# Patient Record
Sex: Female | Born: 1945 | Race: White | Hispanic: No | Marital: Married | Smoking: Never smoker
Health system: Southern US, Community
[De-identification: ages and names within clinical notes are randomized; demographics above are authoritative.]

## PROBLEM LIST (undated history)

## (undated) DIAGNOSIS — I10 Essential (primary) hypertension: Secondary | ICD-10-CM

## (undated) DIAGNOSIS — E079 Disorder of thyroid, unspecified: Secondary | ICD-10-CM

## (undated) DIAGNOSIS — J45909 Unspecified asthma, uncomplicated: Secondary | ICD-10-CM

## (undated) DIAGNOSIS — E119 Type 2 diabetes mellitus without complications: Secondary | ICD-10-CM

## (undated) HISTORY — PX: BREAST BIOPSY: SHX20

## (undated) HISTORY — PX: CARPAL TUNNEL RELEASE: SHX101

## (undated) HISTORY — PX: TONSILLECTOMY: SUR1361

---

## 2020-07-23 ENCOUNTER — Ambulatory Visit: Payer: Self-pay | Admitting: Physician Assistant

## 2020-10-17 ENCOUNTER — Other Ambulatory Visit: Payer: Self-pay | Admitting: Critical Care Medicine

## 2020-10-17 DIAGNOSIS — Z1231 Encounter for screening mammogram for malignant neoplasm of breast: Secondary | ICD-10-CM

## 2020-12-31 ENCOUNTER — Ambulatory Visit: Payer: Self-pay

## 2021-01-30 ENCOUNTER — Other Ambulatory Visit: Payer: Self-pay

## 2021-01-30 ENCOUNTER — Ambulatory Visit
Admission: RE | Admit: 2021-01-30 | Discharge: 2021-01-30 | Disposition: A | Discharge status: Home / Self Care | Payer: Medicare HMO | Source: Ambulatory Visit | Attending: Critical Care Medicine | Admitting: Critical Care Medicine

## 2021-01-30 DIAGNOSIS — Z1231 Encounter for screening mammogram for malignant neoplasm of breast: Secondary | ICD-10-CM

## 2021-02-06 ENCOUNTER — Other Ambulatory Visit: Payer: Self-pay | Admitting: Family Medicine

## 2021-02-06 DIAGNOSIS — E2839 Other primary ovarian failure: Secondary | ICD-10-CM

## 2021-07-26 ENCOUNTER — Ambulatory Visit
Admission: RE | Admit: 2021-07-26 | Discharge: 2021-07-26 | Disposition: A | Discharge status: Home / Self Care | Payer: Medicare HMO | Source: Ambulatory Visit | Attending: Family Medicine | Admitting: Family Medicine

## 2021-07-26 DIAGNOSIS — E2839 Other primary ovarian failure: Secondary | ICD-10-CM

## 2021-10-14 DIAGNOSIS — G5603 Carpal tunnel syndrome, bilateral upper limbs: Secondary | ICD-10-CM | POA: Diagnosis not present

## 2021-10-23 DIAGNOSIS — G5601 Carpal tunnel syndrome, right upper limb: Secondary | ICD-10-CM | POA: Diagnosis not present

## 2021-12-13 ENCOUNTER — Other Ambulatory Visit: Payer: Self-pay | Admitting: Family Medicine

## 2021-12-13 DIAGNOSIS — Z1231 Encounter for screening mammogram for malignant neoplasm of breast: Secondary | ICD-10-CM

## 2022-01-31 ENCOUNTER — Ambulatory Visit
Admission: RE | Admit: 2022-01-31 | Discharge: 2022-01-31 | Disposition: A | Discharge status: Home / Self Care | Payer: Medicare HMO | Source: Ambulatory Visit | Attending: Family Medicine | Admitting: Family Medicine

## 2022-01-31 DIAGNOSIS — Z1231 Encounter for screening mammogram for malignant neoplasm of breast: Secondary | ICD-10-CM | POA: Diagnosis not present

## 2022-02-03 ENCOUNTER — Other Ambulatory Visit: Payer: Self-pay | Admitting: Family Medicine

## 2022-02-03 DIAGNOSIS — E2839 Other primary ovarian failure: Secondary | ICD-10-CM | POA: Diagnosis not present

## 2022-02-03 DIAGNOSIS — E0590 Thyrotoxicosis, unspecified without thyrotoxic crisis or storm: Secondary | ICD-10-CM | POA: Diagnosis not present

## 2022-02-03 DIAGNOSIS — J4540 Moderate persistent asthma, uncomplicated: Secondary | ICD-10-CM | POA: Diagnosis not present

## 2022-02-03 DIAGNOSIS — E039 Hypothyroidism, unspecified: Secondary | ICD-10-CM | POA: Diagnosis not present

## 2022-02-03 DIAGNOSIS — E7800 Pure hypercholesterolemia, unspecified: Secondary | ICD-10-CM | POA: Diagnosis not present

## 2022-02-03 DIAGNOSIS — R928 Other abnormal and inconclusive findings on diagnostic imaging of breast: Secondary | ICD-10-CM

## 2022-02-03 DIAGNOSIS — R7303 Prediabetes: Secondary | ICD-10-CM | POA: Diagnosis not present

## 2022-02-03 DIAGNOSIS — Z0001 Encounter for general adult medical examination with abnormal findings: Secondary | ICD-10-CM | POA: Diagnosis not present

## 2022-02-03 DIAGNOSIS — I10 Essential (primary) hypertension: Secondary | ICD-10-CM | POA: Diagnosis not present

## 2022-02-03 DIAGNOSIS — Z79899 Other long term (current) drug therapy: Secondary | ICD-10-CM | POA: Diagnosis not present

## 2022-02-04 DIAGNOSIS — Z0001 Encounter for general adult medical examination with abnormal findings: Secondary | ICD-10-CM | POA: Diagnosis not present

## 2022-02-12 ENCOUNTER — Other Ambulatory Visit: Payer: Medicare HMO

## 2022-02-19 ENCOUNTER — Other Ambulatory Visit: Payer: Medicare HMO

## 2022-02-25 DIAGNOSIS — Z23 Encounter for immunization: Secondary | ICD-10-CM | POA: Diagnosis not present

## 2022-02-27 ENCOUNTER — Ambulatory Visit
Admission: RE | Admit: 2022-02-27 | Discharge: 2022-02-27 | Disposition: A | Discharge status: Home / Self Care | Payer: Medicare HMO | Source: Ambulatory Visit | Attending: Family Medicine | Admitting: Family Medicine

## 2022-02-27 DIAGNOSIS — N6001 Solitary cyst of right breast: Secondary | ICD-10-CM | POA: Diagnosis not present

## 2022-02-27 DIAGNOSIS — R928 Other abnormal and inconclusive findings on diagnostic imaging of breast: Secondary | ICD-10-CM

## 2022-04-16 DIAGNOSIS — R5383 Other fatigue: Secondary | ICD-10-CM | POA: Diagnosis not present

## 2022-04-16 DIAGNOSIS — J029 Acute pharyngitis, unspecified: Secondary | ICD-10-CM | POA: Diagnosis not present

## 2022-04-16 DIAGNOSIS — R051 Acute cough: Secondary | ICD-10-CM | POA: Diagnosis not present

## 2022-04-16 DIAGNOSIS — R52 Pain, unspecified: Secondary | ICD-10-CM | POA: Diagnosis not present

## 2022-04-16 DIAGNOSIS — Z03818 Encounter for observation for suspected exposure to other biological agents ruled out: Secondary | ICD-10-CM | POA: Diagnosis not present

## 2022-04-28 DIAGNOSIS — R7303 Prediabetes: Secondary | ICD-10-CM | POA: Diagnosis not present

## 2022-04-28 DIAGNOSIS — Z961 Presence of intraocular lens: Secondary | ICD-10-CM | POA: Diagnosis not present

## 2022-04-28 DIAGNOSIS — I10 Essential (primary) hypertension: Secondary | ICD-10-CM | POA: Diagnosis not present

## 2022-04-28 DIAGNOSIS — H524 Presbyopia: Secondary | ICD-10-CM | POA: Diagnosis not present

## 2022-04-28 DIAGNOSIS — H35033 Hypertensive retinopathy, bilateral: Secondary | ICD-10-CM | POA: Diagnosis not present

## 2022-05-08 DIAGNOSIS — E039 Hypothyroidism, unspecified: Secondary | ICD-10-CM | POA: Diagnosis not present

## 2022-06-03 DIAGNOSIS — H6691 Otitis media, unspecified, right ear: Secondary | ICD-10-CM | POA: Diagnosis not present

## 2022-06-03 DIAGNOSIS — Z03818 Encounter for observation for suspected exposure to other biological agents ruled out: Secondary | ICD-10-CM | POA: Diagnosis not present

## 2022-06-03 DIAGNOSIS — R051 Acute cough: Secondary | ICD-10-CM | POA: Diagnosis not present

## 2022-06-03 DIAGNOSIS — J069 Acute upper respiratory infection, unspecified: Secondary | ICD-10-CM | POA: Diagnosis not present

## 2022-06-03 DIAGNOSIS — J029 Acute pharyngitis, unspecified: Secondary | ICD-10-CM | POA: Diagnosis not present

## 2022-06-08 DIAGNOSIS — H66003 Acute suppurative otitis media without spontaneous rupture of ear drum, bilateral: Secondary | ICD-10-CM | POA: Diagnosis not present

## 2022-06-16 DIAGNOSIS — H6501 Acute serous otitis media, right ear: Secondary | ICD-10-CM | POA: Diagnosis not present

## 2022-06-16 DIAGNOSIS — H9191 Unspecified hearing loss, right ear: Secondary | ICD-10-CM | POA: Diagnosis not present

## 2022-07-04 DIAGNOSIS — H903 Sensorineural hearing loss, bilateral: Secondary | ICD-10-CM | POA: Diagnosis not present

## 2022-07-15 DIAGNOSIS — H8110 Benign paroxysmal vertigo, unspecified ear: Secondary | ICD-10-CM | POA: Diagnosis not present

## 2022-07-31 DIAGNOSIS — E119 Type 2 diabetes mellitus without complications: Secondary | ICD-10-CM | POA: Diagnosis not present

## 2022-07-31 DIAGNOSIS — R200 Anesthesia of skin: Secondary | ICD-10-CM | POA: Diagnosis not present

## 2022-07-31 DIAGNOSIS — I10 Essential (primary) hypertension: Secondary | ICD-10-CM | POA: Diagnosis not present

## 2022-07-31 DIAGNOSIS — E7800 Pure hypercholesterolemia, unspecified: Secondary | ICD-10-CM | POA: Diagnosis not present

## 2022-07-31 DIAGNOSIS — J4540 Moderate persistent asthma, uncomplicated: Secondary | ICD-10-CM | POA: Diagnosis not present

## 2022-07-31 DIAGNOSIS — E039 Hypothyroidism, unspecified: Secondary | ICD-10-CM | POA: Diagnosis not present

## 2022-08-05 DIAGNOSIS — H903 Sensorineural hearing loss, bilateral: Secondary | ICD-10-CM | POA: Diagnosis not present

## 2022-08-05 DIAGNOSIS — H8102 Meniere's disease, left ear: Secondary | ICD-10-CM | POA: Diagnosis not present

## 2022-08-18 ENCOUNTER — Encounter: Payer: Self-pay | Admitting: Physical Medicine & Rehabilitation

## 2022-09-18 ENCOUNTER — Encounter: Payer: Medicare HMO | Admitting: Physical Medicine & Rehabilitation

## 2022-10-07 ENCOUNTER — Ambulatory Visit: Payer: Medicare HMO | Admitting: Physical Medicine & Rehabilitation

## 2022-10-09 ENCOUNTER — Ambulatory Visit: Payer: Medicare HMO | Admitting: Physical Medicine & Rehabilitation

## 2022-10-16 ENCOUNTER — Encounter: Payer: Self-pay | Admitting: Physical Medicine & Rehabilitation

## 2022-10-16 ENCOUNTER — Encounter: Payer: Medicare HMO | Attending: Physical Medicine & Rehabilitation | Admitting: Physical Medicine & Rehabilitation

## 2022-10-16 VITALS — BP 142/79 | HR 82 | Ht 59.0 in | Wt 179.6 lb

## 2022-10-16 DIAGNOSIS — R202 Paresthesia of skin: Secondary | ICD-10-CM | POA: Insufficient documentation

## 2022-10-16 DIAGNOSIS — R200 Anesthesia of skin: Secondary | ICD-10-CM | POA: Diagnosis not present

## 2022-10-23 ENCOUNTER — Encounter: Payer: Self-pay | Admitting: Physical Medicine & Rehabilitation

## 2022-10-23 NOTE — Progress Notes (Signed)
EMG/NCV LUE see scanned report under media tab

## 2022-11-19 DIAGNOSIS — G5603 Carpal tunnel syndrome, bilateral upper limbs: Secondary | ICD-10-CM | POA: Diagnosis not present

## 2022-11-20 ENCOUNTER — Other Ambulatory Visit: Payer: Self-pay | Admitting: Orthopedic Surgery

## 2022-12-09 ENCOUNTER — Other Ambulatory Visit: Payer: Self-pay

## 2022-12-09 ENCOUNTER — Encounter (HOSPITAL_BASED_OUTPATIENT_CLINIC_OR_DEPARTMENT_OTHER): Payer: Self-pay | Admitting: Orthopedic Surgery

## 2022-12-15 ENCOUNTER — Encounter (HOSPITAL_BASED_OUTPATIENT_CLINIC_OR_DEPARTMENT_OTHER)
Admission: RE | Admit: 2022-12-15 | Discharge: 2022-12-15 | Disposition: A | Discharge status: Home / Self Care | Payer: Medicare HMO | Source: Ambulatory Visit | Attending: Orthopedic Surgery | Admitting: Orthopedic Surgery

## 2022-12-15 DIAGNOSIS — Z01818 Encounter for other preprocedural examination: Secondary | ICD-10-CM | POA: Insufficient documentation

## 2022-12-15 LAB — BASIC METABOLIC PANEL
Anion gap: 8 (ref 5–15)
BUN: 18 mg/dL (ref 8–23)
CO2: 28 mmol/L (ref 22–32)
Calcium: 9.5 mg/dL (ref 8.9–10.3)
Chloride: 101 mmol/L (ref 98–111)
Creatinine, Ser: 0.65 mg/dL (ref 0.44–1.00)
GFR, Estimated: >60 mL/min (ref >60)
Glucose, Bld: 100 mg/dL — ABNORMAL HIGH (ref 70–99)
Potassium: 4.1 mmol/L (ref 3.5–5.1)
Sodium: 137 mmol/L (ref 135–145)

## 2022-12-15 NOTE — Progress Notes (Signed)

## 2022-12-22 ENCOUNTER — Ambulatory Visit (HOSPITAL_BASED_OUTPATIENT_CLINIC_OR_DEPARTMENT_OTHER): Payer: Medicare HMO | Admitting: Certified Registered"

## 2022-12-22 ENCOUNTER — Other Ambulatory Visit: Payer: Self-pay

## 2022-12-22 ENCOUNTER — Encounter (HOSPITAL_BASED_OUTPATIENT_CLINIC_OR_DEPARTMENT_OTHER): Payer: Self-pay | Admitting: Orthopedic Surgery

## 2022-12-22 ENCOUNTER — Encounter (HOSPITAL_BASED_OUTPATIENT_CLINIC_OR_DEPARTMENT_OTHER)
Admission: RE | Disposition: A | Discharge status: Home / Self Care | Payer: Self-pay | Source: Home / Self Care | Attending: Orthopedic Surgery

## 2022-12-22 ENCOUNTER — Ambulatory Visit (HOSPITAL_BASED_OUTPATIENT_CLINIC_OR_DEPARTMENT_OTHER)
Admission: RE | Admit: 2022-12-22 | Discharge: 2022-12-22 | Disposition: A | Discharge status: Home / Self Care | Payer: Medicare HMO | Attending: Orthopedic Surgery | Admitting: Orthopedic Surgery

## 2022-12-22 DIAGNOSIS — Z7984 Long term (current) use of oral hypoglycemic drugs: Secondary | ICD-10-CM | POA: Insufficient documentation

## 2022-12-22 DIAGNOSIS — E039 Hypothyroidism, unspecified: Secondary | ICD-10-CM | POA: Insufficient documentation

## 2022-12-22 DIAGNOSIS — G5602 Carpal tunnel syndrome, left upper limb: Secondary | ICD-10-CM

## 2022-12-22 DIAGNOSIS — I119 Hypertensive heart disease without heart failure: Secondary | ICD-10-CM | POA: Insufficient documentation

## 2022-12-22 DIAGNOSIS — Z01818 Encounter for other preprocedural examination: Secondary | ICD-10-CM

## 2022-12-22 DIAGNOSIS — E119 Type 2 diabetes mellitus without complications: Secondary | ICD-10-CM | POA: Diagnosis not present

## 2022-12-22 HISTORY — DX: Unspecified asthma, uncomplicated: J45.909

## 2022-12-22 HISTORY — DX: Disorder of thyroid, unspecified: E07.9

## 2022-12-22 HISTORY — DX: Essential (primary) hypertension: I10

## 2022-12-22 HISTORY — PX: CARPAL TUNNEL RELEASE: SHX101

## 2022-12-22 HISTORY — DX: Type 2 diabetes mellitus without complications: E11.9

## 2022-12-22 LAB — GLUCOSE, CAPILLARY
Glucose-Capillary: 100 mg/dL — ABNORMAL HIGH (ref 70–99)
Glucose-Capillary: 108 mg/dL — ABNORMAL HIGH (ref 70–99)

## 2022-12-22 SURGERY — CARPAL TUNNEL RELEASE
Anesthesia: Monitor Anesthesia Care | Site: Hand | Laterality: Left

## 2022-12-22 MED ORDER — CEFAZOLIN SODIUM-DEXTROSE 2-4 GM/100ML-% IV SOLN
2.0000 g | INTRAVENOUS | Status: AC
  Administered 2022-12-22: 2 g via INTRAVENOUS

## 2022-12-22 MED ORDER — FENTANYL CITRATE (PF) 100 MCG/2ML IJ SOLN
INTRAMUSCULAR | Status: AC
  Filled 2022-12-22: qty 2

## 2022-12-22 MED ORDER — LIDOCAINE HCL (PF) 0.5 % IJ SOLN
INTRAMUSCULAR | Status: DC | PRN
  Administered 2022-12-22: 30 mL via INTRAVENOUS

## 2022-12-22 MED ORDER — ACETAMINOPHEN 500 MG PO TABS
1000.0000 mg | ORAL_TABLET | Freq: Once | ORAL | Status: AC
  Administered 2022-12-22: 1000 mg via ORAL

## 2022-12-22 MED ORDER — CEFAZOLIN SODIUM-DEXTROSE 2-4 GM/100ML-% IV SOLN
INTRAVENOUS | Status: AC
  Filled 2022-12-22: qty 100

## 2022-12-22 MED ORDER — HYDROCODONE-ACETAMINOPHEN 5-325 MG PO TABS: ORAL_TABLET | ORAL | 0 refills | Status: AC

## 2022-12-22 MED ORDER — ONDANSETRON HCL 4 MG/2ML IJ SOLN
INTRAMUSCULAR | Status: DC | PRN
  Administered 2022-12-22: 4 mg via INTRAVENOUS

## 2022-12-22 MED ORDER — OXYCODONE HCL 5 MG PO TABS: 5.0000 mg | ORAL_TABLET | Freq: Once | ORAL | Status: DC | PRN

## 2022-12-22 MED ORDER — ONDANSETRON HCL 4 MG/2ML IJ SOLN: 4.0000 mg | Freq: Once | INTRAMUSCULAR | Status: DC | PRN

## 2022-12-22 MED ORDER — 0.9 % SODIUM CHLORIDE (POUR BTL) OPTIME
TOPICAL | Status: DC | PRN
  Administered 2022-12-22: 75 mL

## 2022-12-22 MED ORDER — ACETAMINOPHEN 500 MG PO TABS
ORAL_TABLET | ORAL | Status: AC
  Filled 2022-12-22: qty 2

## 2022-12-22 MED ORDER — LIDOCAINE 2% (20 MG/ML) 5 ML SYRINGE
INTRAMUSCULAR | Status: AC
  Filled 2022-12-22: qty 10

## 2022-12-22 MED ORDER — FENTANYL CITRATE (PF) 100 MCG/2ML IJ SOLN
INTRAMUSCULAR | Status: DC | PRN
  Administered 2022-12-22: 50 ug via INTRAVENOUS

## 2022-12-22 MED ORDER — LACTATED RINGERS IV SOLN: INTRAVENOUS | Status: DC

## 2022-12-22 MED ORDER — BUPIVACAINE HCL (PF) 0.25 % IJ SOLN
INTRAMUSCULAR | Status: DC | PRN
  Administered 2022-12-22: 9 mL

## 2022-12-22 MED ORDER — PROPOFOL 500 MG/50ML IV EMUL
INTRAVENOUS | Status: AC
  Filled 2022-12-22: qty 50

## 2022-12-22 MED ORDER — ONDANSETRON HCL 4 MG/2ML IJ SOLN
INTRAMUSCULAR | Status: AC
  Filled 2022-12-22: qty 2

## 2022-12-22 MED ORDER — PROPOFOL 500 MG/50ML IV EMUL
INTRAVENOUS | Status: DC | PRN
  Administered 2022-12-22: 75 ug/kg/min via INTRAVENOUS

## 2022-12-22 MED ORDER — OXYCODONE HCL 5 MG/5ML PO SOLN: 5.0000 mg | Freq: Once | ORAL | Status: DC | PRN

## 2022-12-22 MED ORDER — AMISULPRIDE (ANTIEMETIC) 5 MG/2ML IV SOLN: 10.0000 mg | Freq: Once | INTRAVENOUS | Status: DC | PRN

## 2022-12-22 MED ORDER — FENTANYL CITRATE (PF) 100 MCG/2ML IJ SOLN: 25.0000 ug | INTRAMUSCULAR | Status: DC | PRN

## 2022-12-22 SURGICAL SUPPLY — 37 items
APL PRP STRL LF DISP 70% ISPRP (MISCELLANEOUS) ×1
BLADE SURG 15 STRL LF DISP TIS (BLADE) ×2 IMPLANT
BLADE SURG 15 STRL SS (BLADE) ×2
BNDG CMPR 5X3 KNIT ELC UNQ LF (GAUZE/BANDAGES/DRESSINGS) ×1
BNDG CMPR 9X4 STRL LF SNTH (GAUZE/BANDAGES/DRESSINGS)
BNDG ELASTIC 3INX 5YD STR LF (GAUZE/BANDAGES/DRESSINGS) ×1 IMPLANT
BNDG ESMARK 4X9 LF (GAUZE/BANDAGES/DRESSINGS) IMPLANT
BNDG GAUZE DERMACEA FLUFF 4 (GAUZE/BANDAGES/DRESSINGS) ×1 IMPLANT
BNDG GZE DERMACEA 4 6PLY (GAUZE/BANDAGES/DRESSINGS) ×1
CHLORAPREP W/TINT 26 (MISCELLANEOUS) ×1 IMPLANT
CORD BIPOLAR FORCEPS 12FT (ELECTRODE) ×1 IMPLANT
COVER BACK TABLE 60X90IN (DRAPES) ×1 IMPLANT
COVER MAYO STAND STRL (DRAPES) ×1 IMPLANT
CUFF TOURN SGL QUICK 18X4 (TOURNIQUET CUFF) ×1 IMPLANT
DRAPE EXTREMITY T 121X128X90 (DISPOSABLE) ×1 IMPLANT
DRAPE SURG 17X23 STRL (DRAPES) ×1 IMPLANT
GAUZE PAD ABD 8X10 STRL (GAUZE/BANDAGES/DRESSINGS) ×1 IMPLANT
GAUZE SPONGE 4X4 12PLY STRL (GAUZE/BANDAGES/DRESSINGS) ×1 IMPLANT
GAUZE XEROFORM 1X8 LF (GAUZE/BANDAGES/DRESSINGS) ×1 IMPLANT
GLOVE BIO SURGEON STRL SZ7 (GLOVE) IMPLANT
GLOVE BIO SURGEON STRL SZ7.5 (GLOVE) ×1 IMPLANT
GLOVE BIOGEL PI IND STRL 7.0 (GLOVE) IMPLANT
GLOVE BIOGEL PI IND STRL 8 (GLOVE) ×1 IMPLANT
GOWN STRL REUS W/ TWL LRG LVL3 (GOWN DISPOSABLE) ×1 IMPLANT
GOWN STRL REUS W/TWL LRG LVL3 (GOWN DISPOSABLE) ×1
GOWN STRL REUS W/TWL XL LVL3 (GOWN DISPOSABLE) ×1 IMPLANT
NDL HYPO 25X1 1.5 SAFETY (NEEDLE) ×1 IMPLANT
NEEDLE HYPO 25X1 1.5 SAFETY (NEEDLE) ×1 IMPLANT
NS IRRIG 1000ML POUR BTL (IV SOLUTION) ×1 IMPLANT
PACK BASIN DAY SURGERY FS (CUSTOM PROCEDURE TRAY) ×1 IMPLANT
PADDING CAST ABS COTTON 4X4 ST (CAST SUPPLIES) ×1 IMPLANT
STOCKINETTE 4X48 STRL (DRAPES) ×1 IMPLANT
SUT ETHILON 4 0 PS 2 18 (SUTURE) ×1 IMPLANT
SYR BULB EAR ULCER 3OZ GRN STR (SYRINGE) ×1 IMPLANT
SYR CONTROL 10ML LL (SYRINGE) ×1 IMPLANT
TOWEL GREEN STERILE FF (TOWEL DISPOSABLE) ×2 IMPLANT
UNDERPAD 30X36 HEAVY ABSORB (UNDERPADS AND DIAPERS) ×1 IMPLANT

## 2022-12-22 NOTE — Op Note (Signed)
12/22/2022 [facility]                              OPERATIVE REPORT   PREOPERATIVE DIAGNOSIS:  Left carpal tunnel syndrome.  POSTOPERATIVE DIAGNOSIS:  Left carpal tunnel syndrome.  PROCEDURE:  Left carpal tunnel release.  SURGEON:  Betha Loa, MD  ASSISTANT:  none.  ANESTHESIA: Bier block with sedation  IV FLUIDS:  Per anesthesia flow sheet.  ESTIMATED BLOOD LOSS:  Minimal.  COMPLICATIONS:  None.  SPECIMENS:  None.  TOURNIQUET TIME:    Total Tourniquet Time Documented: Forearm (Left) - 23 minutes Total: Forearm (Left) - 23 minutes   DISPOSITION:  Stable to PACU.  LOCATION: [facility]  INDICATIONS:  77 y.o. yo female with numbness and tingling left hand.  Nocturnal symptoms. Positive nerve conduction studies. She wishes to proceed with left carpal tunnel release.  Risks, benefits and alternatives of surgery were discussed including the risk of blood loss; infection; damage to nerves, vessels, tendons, ligaments, bone; failure of surgery; need for additional surgery; complications with wound healing; continued pain; recurrence of carpal tunnel syndrome; and damage to motor branch. She voiced understanding of these risks and elected to proceed.   OPERATIVE COURSE:  After being identified preoperatively by myself, the patient and I agreed upon the procedure and site of procedure.  The surgical site was marked.  Surgical consent had been signed.  She was given IV Ancef as preoperative antibiotic prophylaxis.  She was transferred to the operating room and placed on the operating room table in supine position with the Left upper extremity on an armboard.  Bier block anesthesia was induced by the anesthesiologist.  Left upper extremity was prepped and draped in normal sterile orthopaedic fashion.  A surgical pause was performed between the surgeons, anesthesia, and operating room staff, and all were in agreement as to the patient, procedure, and site of  procedure.  Tourniquet at the proximal aspect of the forearm had been inflated for the Bier block  Incision was made over the transverse carpal ligament and carried into the subcutaneous tissues by spreading technique.  Bipolar electrocautery was used to obtain hemostasis.  The palmar fascia was sharply incised.  The transverse carpal ligament was identified.  The fascia distal to the ligament was opened.  Retractor was placed and the flexor tendons were identified.  The flexor tendon to the little finger was identified and retracted radially.  The transverse carpal ligament was then incised from distal to proximal under direct visualization.  Scissors were used to split the distal aspect of the volar antebrachial fascia.  A finger was placed into the wound to ensure complete decompression, which was the case.  The nerve was examined.  There was a persistent median artery.  The motor branch was identified and was intact.  The wound was copiously irrigated with sterile saline.  It was then closed with 4-0 nylon in a horizontal mattress fashion.  It was injected with 0.25% plain Marcaine to aid in postoperative analgesia.  It was dressed with sterile Xeroform, 4x4s, an ABD, and wrapped with Kerlix and an Ace bandage.  Tourniquet was deflated at 23 minutes.  Fingertips were pink with brisk capillary refill after deflation of the tourniquet.  Operative drapes were broken down.  The patient was awoken from anesthesia safely.  She was transferred back to stretcher and taken to the PACU in stable condition.  I will see her  back in the office in 1 week for postoperative followup.  I will give her a prescription for Norco 5/325 1-2 tabs PO q6 hours prn pain, dispense # 15.    Betha Loa, MD Electronically signed, 12/22/22

## 2022-12-22 NOTE — Anesthesia Preprocedure Evaluation (Signed)
Anesthesia Evaluation  Patient identified by MRN, date of birth, ID band Patient awake    Reviewed: Allergy & Precautions, NPO status , Patient's Chart, lab work & pertinent test results  Airway Mallampati: III  TM Distance: >3 FB Neck ROM: Full    Dental  (+) Teeth Intact, Dental Advisory Given   Pulmonary asthma    Pulmonary exam normal breath sounds clear to auscultation       Cardiovascular hypertension, Pt. on medications Normal cardiovascular exam Rhythm:Regular Rate:Normal     Neuro/Psych negative neurological ROS     GI/Hepatic negative GI ROS, Neg liver ROS,,,  Endo/Other  diabetes, Well Controlled, Type 2, Oral Hypoglycemic AgentsHypothyroidism    Renal/GU negative Renal ROS     Musculoskeletal negative musculoskeletal ROS (+)    Abdominal   Peds  Hematology negative hematology ROS (+)   Anesthesia Other Findings   Reproductive/Obstetrics                             Anesthesia Physical Anesthesia Plan  ASA: 2  Anesthesia Plan: MAC and Bier Block and Bier Block-Lidocaine Only   Post-op Pain Management: Tylenol PO (pre-op)*   Induction:   PONV Risk Score and Plan: 2 and Propofol infusion and TIVA  Airway Management Planned: Natural Airway and Simple Face Mask  Additional Equipment: None  Intra-op Plan:   Post-operative Plan:   Informed Consent: I have reviewed the patients History and Physical, chart, labs and discussed the procedure including the risks, benefits and alternatives for the proposed anesthesia with the patient or authorized representative who has indicated his/her understanding and acceptance.       Plan Discussed with: CRNA  Anesthesia Plan Comments:        Anesthesia Quick Evaluation

## 2022-12-22 NOTE — Anesthesia Procedure Notes (Signed)
Anesthesia Regional Block: Bier block (IV Regional)   Pre-Anesthetic Checklist: , timeout performed,  Correct Patient, Correct Site, Correct Laterality,  Correct Procedure,, site marked,  Surgical consent,  At surgeon's request  Laterality: Left         Needles:  Injection technique: Single-shot  Needle Type: Other      Needle Gauge: 20     Additional Needles:   Procedures:,,,,, intact distal pulses, Esmarch exsanguination,  Single tourniquet utilized,  #20gu IV placed    Narrative:  Start time: 12/22/2022 1:18 PM End time: 12/22/2022 1:19 PM Injection made incrementally with aspirations every 30 mL.  Events: positive IV test  Performed by: With CRNAs  CRNA: Cleda Clarks, CRNA

## 2022-12-22 NOTE — Discharge Instructions (Addendum)
[facility] Instructions Hand Surgery  Wound Care: Keep your hand elevated above the level of your heart.  Do not allow it to dangle by your side.  Keep the dressing dry and do not remove it unless your doctor advises you to do so.  He will usually change it at the time of your post-op visit.  Moving your fingers is advised to stimulate circulation but will depend on the site of your surgery.  If you have a splint applied, your doctor will advise you regarding movement.  Activity: Do not drive or operate machinery today.  Rest today and then you may return to your normal activity and work as indicated by your physician.  Diet:  Drink liquids today or eat a light diet.  You may resume a regular diet tomorrow.    General expectations: Pain for two to three days. Fingers may become slightly swollen.  Call your doctor if any of the following occur: Post Anesthesia Home Care Instructions  Activity: Get plenty of rest for the remainder of the day. A responsible individual must stay with you for 24 hours following the procedure.  For the next 24 hours, DO NOT: -Drive a car -Advertising copywriter -Drink alcoholic beverages -Take any medication unless instructed by your physician -Make any legal decisions or sign important papers.  Meals: Start with liquid foods such as gelatin or soup. Progress to regular foods as tolerated. Avoid greasy, spicy, heavy foods. If nausea and/or vomiting occur, drink only clear liquids until the nausea and/or vomiting subsides. Call your physician if vomiting continues.  Special Instructions/Symptoms: Your throat may feel dry or sore from the anesthesia or the breathing tube placed in your throat during surgery. If this causes discomfort, gargle with warm salt water. The discomfort should disappear within 24 hours.  If you had a scopolamine patch placed behind your ear for the management of post- operative nausea and/or vomiting:  1. The medication in the patch  is effective for 72 hours, after which it should be removed.  Wrap patch in a tissue and discard in the trash. Wash hands thoroughly with soap and water. 2. You may remove the patch earlier than 72 hours if you experience unpleasant side effects which may include dry mouth, dizziness or visual disturbances. 3. Avoid touching the patch. Wash your hands with soap and water after contact with the patch.  No tylenol until after 5:30 today if needed.    Severe pain not relieved by pain medication. Elevated temperature. Dressing soaked with blood. Inability to move fingers. White or bluish color to fingers.

## 2022-12-22 NOTE — H&P (Signed)
Cassandra Phillips is an 77 y.o. female.   Chief Complaint: carpal tunnel syndrome HPI: 77 y.o. yo female with numbness and tingling left hand.  Positive nerve conduction studies. She wishes to have left carpal tunnel release.   Allergies:  Allergies  Allergen Reactions   Penicillins Other (See Comments) and Rash   Sulfa Antibiotics Other (See Comments) and Rash    Other reaction(s): rash    Past Medical History:  Diagnosis Date   Asthma    Diabetes mellitus without complication (HCC)    Hypertension    Thyroid disease     Past Surgical History:  Procedure Laterality Date   BREAST BIOPSY     CARPAL TUNNEL RELEASE Right    CESAREAN SECTION     TONSILLECTOMY      Family History: Family History  Problem Relation Age of Onset   Breast cancer Cousin    Breast cancer Cousin     Social History:   reports that she has never smoked. She has never been exposed to tobacco smoke. She has never used smokeless tobacco. She reports that she does not currently use alcohol. She reports that she does not use drugs.  Medications: Medications Prior to Admission  Medication Sig Dispense Refill   aspirin EC 81 MG tablet Take 81 mg by mouth daily. Swallow whole.     atorvastatin (LIPITOR) 40 MG tablet Take 40 mg by mouth daily.     chlorthalidone (HYGROTON) 50 MG tablet Take 50 mg by mouth daily.     levothyroxine (SYNTHROID) 112 MCG tablet Take 112 mcg by mouth daily before breakfast.     metFORMIN (GLUCOPHAGE-XR) 500 MG 24 hr tablet Take 1,000 mg by mouth daily with breakfast.     montelukast (SINGULAIR) 10 MG tablet Take 10 mg by mouth at bedtime.     Multiple Vitamin (MULTIVITAMIN ADULT PO) Take by mouth.     potassium chloride SA (KLOR-CON M) 20 MEQ tablet Take 20 mEq by mouth 2 (two) times daily.     SYMBICORT 160-4.5 MCG/ACT inhaler Inhale 2 puffs into the lungs in the morning and at bedtime.     vitamin B-12 (CYANOCOBALAMIN) 100 MCG tablet Take 100 mcg by mouth daily.      Results for  orders placed or performed during the hospital encounter of 12/22/22 (from the past 48 hour(s))  Glucose, capillary     Status: Abnormal   Collection Time: 12/22/22 11:28 AM  Result Value Ref Range   Glucose-Capillary 100 (H) 70 - 99 mg/dL    Comment: Glucose reference range applies only to samples taken after fasting for at least 8 hours.    No results found.    Blood pressure 130/73, pulse 76, temperature 97.7 F (36.5 C), temperature source Tympanic, resp. rate 15, height 5' (1.524 m), weight 81.5 kg, SpO2 99%.  General appearance: alert, cooperative, and appears stated age Head: Normocephalic, without obvious abnormality, atraumatic Neck: supple, symmetrical, trachea midline Extremities: Intact sensation and capillary refill all digits.  +epl/fpl/io.  No wounds.  Pulses: 2+ and symmetric Skin: Skin color, texture, turgor normal. No rashes or lesions Neurologic: Grossly normal Incision/Wound: none  Assessment/Plan Left carpal tunnel syndrome.  Non operative and operative treatment options have been discussed with the patient and patient wishes to proceed with operative treatment. Risks, benefits and alternatives of surgery were discussed including risks of blood loss, infection, damage to nerves/vessels/tendons/ligament/bone, failure of surgery, need for additional surgery, complication with wound healing, stiffness, recurrence, damage to motor branch.  She voiced understanding of these risks and elected to proceed.    Betha Loa 12/22/2022, 1:08 PM

## 2022-12-23 ENCOUNTER — Encounter (HOSPITAL_BASED_OUTPATIENT_CLINIC_OR_DEPARTMENT_OTHER): Payer: Self-pay | Admitting: Orthopedic Surgery

## 2022-12-23 NOTE — Anesthesia Postprocedure Evaluation (Signed)
Anesthesia Post Note  Patient: Cassandra Phillips  Procedure(s) Performed: LEFT CARPAL TUNNEL RELEASE (Left: Hand)     Patient location during evaluation: PACU Anesthesia Type: MAC and Bier Block Level of consciousness: awake and alert Pain management: pain level controlled Vital Signs Assessment: post-procedure vital signs reviewed and stable Respiratory status: spontaneous breathing, nonlabored ventilation and respiratory function stable Cardiovascular status: blood pressure returned to baseline and stable Postop Assessment: no apparent nausea or vomiting Anesthetic complications: no   No notable events documented.  Last Vitals:  Vitals:   12/22/22 1415 12/22/22 1430  BP: 119/68 130/67  Pulse: (!) 56 65  Resp: 13 16  Temp:  (!) 35.9 C  SpO2: 93% 94%    Last Pain:  Vitals:   12/22/22 1430  TempSrc:   PainSc: 0-No pain                 Lannie Fields

## 2022-12-23 NOTE — Addendum Note (Signed)
Addendum  created 12/23/22 1406 by Ronnette Hila, CRNA   Intraprocedure Event edited

## 2022-12-23 NOTE — Transfer of Care (Signed)
Immediate Anesthesia Transfer of Care Note  Patient: Cassandra Phillips  Procedure(s) Performed: LEFT CARPAL TUNNEL RELEASE (Left: Hand)  Patient Location: PACU  Anesthesia Type:MAC  Level of Consciousness: sedated  Airway & Oxygen Therapy: Patient Spontanous Breathing  Post-op Assessment: Report given to RN and Post -op Vital signs reviewed and stable  Post vital signs: Reviewed and stable  Last Vitals:  Vitals Value Taken Time  BP 130/67 12/22/22 1430  Temp 35.9 C 12/22/22 1430  Pulse 65 12/22/22 1430  Resp 16 12/22/22 1430  SpO2 94 % 12/22/22 1430    Last Pain:  Vitals:   12/22/22 1430  TempSrc:   PainSc: 0-No pain      Patients Stated Pain Goal: 8 (12/22/22 1118)  Complications: No notable events documented.

## 2022-12-29 DIAGNOSIS — G5603 Carpal tunnel syndrome, bilateral upper limbs: Secondary | ICD-10-CM | POA: Diagnosis not present

## 2023-01-05 ENCOUNTER — Other Ambulatory Visit: Payer: Self-pay | Admitting: Family Medicine

## 2023-01-05 DIAGNOSIS — G5603 Carpal tunnel syndrome, bilateral upper limbs: Secondary | ICD-10-CM | POA: Diagnosis not present

## 2023-01-05 DIAGNOSIS — Z1231 Encounter for screening mammogram for malignant neoplasm of breast: Secondary | ICD-10-CM

## 2023-02-03 ENCOUNTER — Ambulatory Visit
Admission: RE | Admit: 2023-02-03 | Discharge: 2023-02-03 | Disposition: A | Discharge status: Home / Self Care | Payer: Medicare HMO | Source: Ambulatory Visit | Attending: Family Medicine | Admitting: Family Medicine

## 2023-02-03 DIAGNOSIS — Z1231 Encounter for screening mammogram for malignant neoplasm of breast: Secondary | ICD-10-CM

## 2023-02-04 DIAGNOSIS — G5603 Carpal tunnel syndrome, bilateral upper limbs: Secondary | ICD-10-CM | POA: Diagnosis not present

## 2023-02-11 DIAGNOSIS — Z23 Encounter for immunization: Secondary | ICD-10-CM | POA: Diagnosis not present

## 2023-02-12 DIAGNOSIS — H6592 Unspecified nonsuppurative otitis media, left ear: Secondary | ICD-10-CM | POA: Diagnosis not present

## 2023-02-12 DIAGNOSIS — H8102 Meniere's disease, left ear: Secondary | ICD-10-CM | POA: Diagnosis not present

## 2023-03-18 DIAGNOSIS — Z1331 Encounter for screening for depression: Secondary | ICD-10-CM | POA: Diagnosis not present

## 2023-03-18 DIAGNOSIS — Z6836 Body mass index (BMI) 36.0-36.9, adult: Secondary | ICD-10-CM | POA: Diagnosis not present

## 2023-03-18 DIAGNOSIS — E119 Type 2 diabetes mellitus without complications: Secondary | ICD-10-CM | POA: Diagnosis not present

## 2023-03-18 DIAGNOSIS — Z03818 Encounter for observation for suspected exposure to other biological agents ruled out: Secondary | ICD-10-CM | POA: Diagnosis not present

## 2023-03-18 DIAGNOSIS — E7800 Pure hypercholesterolemia, unspecified: Secondary | ICD-10-CM | POA: Diagnosis not present

## 2023-03-18 DIAGNOSIS — E039 Hypothyroidism, unspecified: Secondary | ICD-10-CM | POA: Diagnosis not present

## 2023-03-18 DIAGNOSIS — I10 Essential (primary) hypertension: Secondary | ICD-10-CM | POA: Diagnosis not present

## 2023-03-18 DIAGNOSIS — Z0001 Encounter for general adult medical examination with abnormal findings: Secondary | ICD-10-CM | POA: Diagnosis not present

## 2023-03-18 DIAGNOSIS — Z79899 Other long term (current) drug therapy: Secondary | ICD-10-CM | POA: Diagnosis not present

## 2023-04-21 DIAGNOSIS — E876 Hypokalemia: Secondary | ICD-10-CM | POA: Diagnosis not present

## 2023-05-14 DIAGNOSIS — Z0001 Encounter for general adult medical examination with abnormal findings: Secondary | ICD-10-CM | POA: Diagnosis not present

## 2023-06-08 DIAGNOSIS — R42 Dizziness and giddiness: Secondary | ICD-10-CM | POA: Diagnosis not present

## 2023-06-08 DIAGNOSIS — J019 Acute sinusitis, unspecified: Secondary | ICD-10-CM | POA: Diagnosis not present

## 2023-06-08 DIAGNOSIS — J029 Acute pharyngitis, unspecified: Secondary | ICD-10-CM | POA: Diagnosis not present

## 2023-06-08 DIAGNOSIS — Z20822 Contact with and (suspected) exposure to covid-19: Secondary | ICD-10-CM | POA: Diagnosis not present

## 2023-08-12 ENCOUNTER — Other Ambulatory Visit: Payer: Self-pay | Admitting: Medical Genetics

## 2023-08-21 ENCOUNTER — Other Ambulatory Visit: Payer: Self-pay

## 2023-08-21 DIAGNOSIS — Z006 Encounter for examination for normal comparison and control in clinical research program: Secondary | ICD-10-CM

## 2023-09-01 LAB — GENECONNECT MOLECULAR SCREEN: Genetic Analysis Overall Interpretation: NEGATIVE

## 2023-12-23 ENCOUNTER — Other Ambulatory Visit: Payer: Self-pay | Admitting: Family Medicine

## 2023-12-23 DIAGNOSIS — Z1231 Encounter for screening mammogram for malignant neoplasm of breast: Secondary | ICD-10-CM

## 2024-02-04 ENCOUNTER — Ambulatory Visit
Admission: RE | Admit: 2024-02-04 | Discharge: 2024-02-04 | Disposition: A | Discharge status: Home / Self Care | Source: Ambulatory Visit

## 2024-02-04 DIAGNOSIS — Z1231 Encounter for screening mammogram for malignant neoplasm of breast: Secondary | ICD-10-CM

## 2024-02-18 DIAGNOSIS — Z23 Encounter for immunization: Secondary | ICD-10-CM | POA: Diagnosis not present

## 2024-04-11 NOTE — Progress Notes (Signed)
 Cassandra Phillips                                          MRN: [id number]   04/11/2024   The [facility] Quality Team Specialist reviewed this patient medical record for the purposes of chart review for care gap closure. The following were reviewed: chart review for care gap closure-kidney health evaluation for diabetes:eGFR  and uACR.    [facility] Quality Team

## 2024-05-17 NOTE — Progress Notes (Signed)
 Cassandra Phillips                                          MRN: [id number]   05/17/2024   The [facility] Quality Team Specialist reviewed this patient medical record for the purposes of chart review for care gap closure. The following were reviewed: chart review for care gap closure-kidney health evaluation for diabetes:eGFR  and uACR.    [facility] Quality Team
